# Patient Record
Sex: Male | Born: 1960 | Race: Black or African American | Hispanic: No | Marital: Married | Smoking: Never smoker
Health system: Southern US, Community
[De-identification: ages and names within clinical notes are randomized; demographics above are authoritative.]

## PROBLEM LIST (undated history)

## (undated) DIAGNOSIS — I10 Essential (primary) hypertension: Secondary | ICD-10-CM

## (undated) DIAGNOSIS — E7800 Pure hypercholesterolemia, unspecified: Secondary | ICD-10-CM

## (undated) DIAGNOSIS — I639 Cerebral infarction, unspecified: Secondary | ICD-10-CM

---

## 2002-11-26 DIAGNOSIS — I639 Cerebral infarction, unspecified: Secondary | ICD-10-CM

## 2002-11-26 HISTORY — DX: Cerebral infarction, unspecified: I63.9

## 2015-05-16 ENCOUNTER — Emergency Department (HOSPITAL_BASED_OUTPATIENT_CLINIC_OR_DEPARTMENT_OTHER): Payer: Medicare Other

## 2015-05-16 ENCOUNTER — Encounter (HOSPITAL_BASED_OUTPATIENT_CLINIC_OR_DEPARTMENT_OTHER): Payer: Self-pay | Admitting: *Deleted

## 2015-05-16 ENCOUNTER — Emergency Department (HOSPITAL_BASED_OUTPATIENT_CLINIC_OR_DEPARTMENT_OTHER)
Admission: EM | Admit: 2015-05-16 | Discharge: 2015-05-16 | Disposition: A | Discharge status: Home / Self Care | Payer: Medicare Other | Attending: Emergency Medicine | Admitting: Emergency Medicine

## 2015-05-16 DIAGNOSIS — Z8673 Personal history of transient ischemic attack (TIA), and cerebral infarction without residual deficits: Secondary | ICD-10-CM | POA: Insufficient documentation

## 2015-05-16 DIAGNOSIS — S4992XA Unspecified injury of left shoulder and upper arm, initial encounter: Secondary | ICD-10-CM | POA: Diagnosis present

## 2015-05-16 DIAGNOSIS — Y9389 Activity, other specified: Secondary | ICD-10-CM | POA: Insufficient documentation

## 2015-05-16 DIAGNOSIS — Z8639 Personal history of other endocrine, nutritional and metabolic disease: Secondary | ICD-10-CM | POA: Insufficient documentation

## 2015-05-16 DIAGNOSIS — I10 Essential (primary) hypertension: Secondary | ICD-10-CM | POA: Diagnosis not present

## 2015-05-16 DIAGNOSIS — Y92009 Unspecified place in unspecified non-institutional (private) residence as the place of occurrence of the external cause: Secondary | ICD-10-CM | POA: Insufficient documentation

## 2015-05-16 DIAGNOSIS — F10120 Alcohol abuse with intoxication, uncomplicated: Secondary | ICD-10-CM | POA: Diagnosis not present

## 2015-05-16 DIAGNOSIS — S43005A Unspecified dislocation of left shoulder joint, initial encounter: Secondary | ICD-10-CM | POA: Insufficient documentation

## 2015-05-16 DIAGNOSIS — W01198A Fall on same level from slipping, tripping and stumbling with subsequent striking against other object, initial encounter: Secondary | ICD-10-CM | POA: Diagnosis not present

## 2015-05-16 DIAGNOSIS — F10920 Alcohol use, unspecified with intoxication, uncomplicated: Secondary | ICD-10-CM

## 2015-05-16 DIAGNOSIS — Y998 Other external cause status: Secondary | ICD-10-CM | POA: Insufficient documentation

## 2015-05-16 HISTORY — DX: Cerebral infarction, unspecified: I63.9

## 2015-05-16 HISTORY — DX: Essential (primary) hypertension: I10

## 2015-05-16 HISTORY — DX: Pure hypercholesterolemia, unspecified: E78.00

## 2015-05-16 MED ORDER — LIDOCAINE HCL 2 % IJ SOLN
20.0000 mL | Freq: Once | INTRAMUSCULAR | Status: AC
  Administered 2015-05-16: 200 mg
  Filled 2015-05-16: qty 20

## 2015-05-16 NOTE — ED Notes (Signed)
L shoulder reduced by Dr. Wilkie Aye, tolerated well, CMS remains intact, "feel better", shouolder immobilizer applied, cxr called for. Family x2 at [facility].

## 2015-05-16 NOTE — Discharge Instructions (Signed)
Wear sling until follow-up with Dr. Pearletha Forge. Shoulder Dislocation Your shoulder is made up of three bones: the collar bone (clavicle); the shoulder blade (scapula), which includes the socket (glenoid cavity); and the upper arm bone (humerus). Your shoulder joint is the place where these bones meet. Strong, fibrous tissues hold these bones together (ligaments). Muscles and strong, fibrous tissues that connect the muscles to these bones (tendons) allow your arm to move through this joint. The range of motion of your shoulder joint is more extensive than most of your other joints, and the glenoid cavity is very shallow. That is the reason that your shoulder joint is one of the most unstable joints in your body. It is far more prone to dislocation than your other joints. Shoulder dislocation is when your humerus is forced out of your shoulder joint. CAUSES Shoulder dislocation is caused by a forceful impact on your shoulder. This impact usually is from an injury, such as a sports injury or a fall. SYMPTOMS Symptoms of shoulder dislocation include:  Deformity of your shoulder.  Intense pain.  Inability to move your shoulder joint.  Numbness, weakness, or tingling around your shoulder joint (your neck or down your arm).  Bruising or swelling around your shoulder. DIAGNOSIS In order to diagnose a dislocated shoulder, your caregiver will perform a physical exam. Your caregiver also may have an X-ray exam done to see if you have any broken bones. Magnetic resonance imaging (MRI) is a procedure that sometimes is done to help your caregiver see any damage to the soft tissues around your shoulder, particularly your rotator cuff tendons. Additionally, your caregiver also may have electromyography done to measure the electrical discharges produced in your muscles if you have signs or symptoms of nerve damage. TREATMENT A shoulder dislocation is treated by placing the humerus back in the joint (reduction). Your  caregiver does this either manually (closed reduction), by moving your humerus back into the joint through manipulation, or through surgery (open reduction). When your humerus is back in place, severe pain should improve almost immediately. You also may need to have surgery if you have a weak shoulder joint or ligaments, and you have recurring shoulder dislocations, despite rehabilitation. In rare cases, surgery is necessary if your nerves or blood vessels are damaged during the dislocation. After your reduction, your arm will be placed in a shoulder immobilizer or sling to keep it from moving. Your caregiver will have you wear your shoulder immobilizer or sling for 3 days to 3 weeks, depending on how serious your dislocation is. When your shoulder immobilizer or sling is removed, your caregiver may prescribe physical therapy to help improve the range of motion in your shoulder joint. HOME CARE INSTRUCTIONS  The following measures can help to reduce pain and speed up the healing process:  Rest your injured joint. Do not move it. Avoid activities similar to the one that caused your injury.  Apply ice to your injured joint for the first day or two after your reduction or as directed by your caregiver. Applying ice helps to reduce inflammation and pain.  Put ice in a plastic bag.  Place a towel between your skin and the bag.  Leave the ice on for 15-20 minutes at a time, every 2 hours while you are awake.  Exercise your hand by squeezing a soft ball. This helps to eliminate stiffness and swelling in your hand and wrist.  Take over-the-counter or prescription medicine for pain or discomfort as told by your caregiver. SEEK  IMMEDIATE MEDICAL CARE IF:   Your shoulder immobilizer or sling becomes damaged.  Your pain becomes worse rather than better.  You lose feeling in your arm or hand, or they become white and cold. MAKE SURE YOU:   Understand these instructions.  Will watch your  condition.  Will get help right away if you are not doing well or get worse. Document Released: 08/07/2001 Document Revised: 03/29/2014 Document Reviewed: 09/02/2011 [facility] Patient Information 2015 [place], [place]. This information is not intended to replace advice given to you by your health care provider. Make sure you discuss any questions you have with your health care provider.

## 2015-05-16 NOTE — ED Notes (Signed)
L shoulder reduction unsuccessful x4 attempts (tolerated well). Pt now prone with L arm hanging off of bed, 5lb weight applied (tolerating well), CMS intact (brachial, radial and ulnar pulses intact, cap refill <2sec, hand fingers warm).

## 2015-05-16 NOTE — ED Notes (Signed)
Post reduction portable at [facility].

## 2015-05-16 NOTE — ED Notes (Addendum)
Family at [facility]. Pt using urinal. Pending imaging. VSS, no changes. Resting comfortably.

## 2015-05-16 NOTE — ED Notes (Signed)
Pt fell at 0030, here by POV with family x2, admits to some ETOH tonight, was locking house up for the night and tripped/ lost balance and fell, relates to L sided residual defecits from [address] CVA, c/o L scapular pain, denies other pains or sx, denies LOC, "think it might be out of socket", no obvious deformity, CMS/ pulses intact. Guarding L arm movements. Also has residual  L arm weakness. Pt lists PCP as Dr. Tera Mater with [facility] on [facility]. 8/10 pain.

## 2015-05-16 NOTE — ED Notes (Signed)
Dr. Horton at [facility].  

## 2015-05-16 NOTE — ED Notes (Addendum)
Delay in triage, VSS, remains on monitor, alert, NAD, calm, family x2 at [facility].

## 2015-05-16 NOTE — ED Provider Notes (Signed)
CSN: [id number]     Arrival date & time 05/16/15  0111 History   First MD Initiated Contact with Patient 05/16/15 0201     Chief Complaint  Patient presents with  . Fall     (Consider location/radiation/quality/duration/timing/severity/associated sxs/prior Treatment) HPI  This a 54 year old male with history of CVA with left-sided deficits who presents following a fall. Per the patient and his family, patient was going to check the doors in his house when he fell hurting his left shoulder. He also hit his head. Patient reports drinking "a few beers tonight." He had baseline has left-sided deficits secondary to a stroke. He denies loss of consciousness. He is on a baby aspirin daily. He denies syncope or dizziness. He denies any other injury.  Past Medical History  Diagnosis Date  . Stroke 2004    L side weakness residual  . Hypertension   . Hypercholesterolemia    History reviewed. No pertinent past surgical history. History reviewed. No pertinent family history. History  Substance Use Topics  . Smoking status: Never Smoker   . Smokeless tobacco: Not on file  . Alcohol Use: Yes    Review of Systems  Constitutional: Negative.   Respiratory: Negative.  Negative for chest tightness and shortness of breath.   Cardiovascular: Negative.  Negative for chest pain.  Gastrointestinal: Negative.  Negative for abdominal pain.  Genitourinary: Negative.   Musculoskeletal: Negative for back pain.        left shoulder pain  Skin: Negative for wound.  Neurological: Negative for dizziness, syncope, light-headedness and headaches.  All other systems reviewed and are negative.     Allergies  Review of patient's allergies indicates no known allergies.  Home Medications   Prior to Admission medications   Not on File   BP 127/87 mmHg  Pulse 70  Temp(Src) 97.6 F (36.4 C) (Oral)  Resp 20  Ht  (1.854 m)  Wt 175 lb (79.379 kg)  BMI 23.09 kg/m2  SpO2 100% Physical Exam   Constitutional: He is oriented to person, place, and time.  Appears intoxicated, intermittently sleeps during interview  HENT:  Head: Normocephalic and atraumatic.  Right Ear: External ear normal.  Left Ear: External ear normal.  Mouth/Throat: Oropharynx is clear and moist.  Eyes:  Pupils 2 mm minimally reactive  Neck: Neck supple.  No midline C-spine tenderness  Cardiovascular: Normal rate, regular rhythm and normal heart sounds.   No murmur heard. Pulmonary/Chest: Effort normal and breath sounds normal. No respiratory distress. He has no wheezes.  Abdominal: Soft. Bowel sounds are normal. There is no tenderness. There is no rebound.  Musculoskeletal: He exhibits no edema.  Vacant glenoid fossa with humeral head palpated anteriorly, decreased range of motion of the left shoulder, 2+ radial pulse, sensation intact distally, strength at baseline  Neurological: He is alert and oriented to person, place, and time.  Somnolent but arousal  Skin: Skin is warm and dry.  Psychiatric: He has a normal mood and affect.  Nursing note and vitals reviewed.   ED Course  Reduction of dislocation Date/Time: 05/16/2015 5:06 AM Performed by: Shon Baton Authorized by: Shon Baton Consent: Verbal consent obtained. Risks and benefits: risks, benefits and alternatives were discussed Consent given by: patient Local anesthesia used: yes Anesthesia: local infiltration Local anesthetic: lidocaine 1% without epinephrine Anesthetic total: 10 ml Patient sedated: no Patient tolerance: Patient tolerated the procedure well with no immediate complications Comments: Anterior shoulder dislocation reduced at the bedside with traction countertraction and  manipulation of the humeral head, neurovascularly intact postreduction   (including critical care time) Labs Review Labs Reviewed - No data to display  Imaging Review Ct Head Wo Contrast  05/16/2015   CLINICAL DATA:  Patient status post fall.  Left shoulder pain. No loss of consciousness. History of prior right-sided infarct.  EXAM: CT HEAD WITHOUT CONTRAST  TECHNIQUE: Contiguous axial images were obtained from the base of the skull through the vertex without intravenous contrast.  COMPARISON:  None.  FINDINGS: Ex vacuo dilatation of the right lateral ventricle. Encephalomalacia within the right cerebral hemisphere likely sequelae of prior infarct. Right periventricular white matter hypodensity. No evidence for acute intracranial hemorrhage, acute cortically based infarct, mass lesion or mass effect. Orbits are unremarkable. Mucosal thickening involving the frontal and maxillary sinuses as well as the ethmoid air cells. Mastoid air cells are well aerated. Calvarium is intact. Age-indeterminate incompletely visualized nasal bone fracture.  IMPRESSION: Likely sequelae of prior infarct involving the right cerebral hemisphere.  Age-indeterminate incompletely visualized nasal bone fracture.  Right periventricular white matter hypodensity, likely sequelae of chronic microvascular ischemic change.   Electronically Signed   By: Annia Belt M.D.   On: 05/16/2015 03:52   Dg Shoulder Left  05/16/2015   CLINICAL DATA:  Status post fall; left shoulder pain. Initial encounter.  EXAM: LEFT SHOULDER - 2+ VIEW  COMPARISON:  None.  FINDINGS: There is anterior dislocation of the left humeral head, with mild impaction of the humeral head against the anterior-inferior glenoid and a likely small Hill-Sachs lesion. No definite osseous Bankart lesion is characterized.  Mild degenerative change is noted at the left acromioclavicular joint. No definite soft tissue abnormalities are seen. The left lung appears clear.  IMPRESSION: Anterior dislocation of the left humeral head, with mild impaction of the humeral head against the anterior-inferior glenoid and a likely small Hill-Sachs lesion. No definite osseous Bankart lesion seen.   Electronically Signed   By: Roanna Raider M.D.    On: 05/16/2015 03:48   Dg Shoulder Left Port  05/16/2015   CLINICAL DATA:  Status post reduction of left humeral head dislocation. Initial encounter.  EXAM: LEFT SHOULDER - 1 VIEW  COMPARISON:  Left shoulder radiographs performed earlier today at 2:54 a.m.  FINDINGS: There has been successful reduction of the left humeral head dislocation. An apparent small fracture line is noted along the lateral aspect of the humeral head, possibly reflecting a greater tuberosity fracture, difficult to fully assess given the positioning of the shoulder.  No definite osseous Bankart lesion is characterized. The visualized portions of the left lung are clear.  IMPRESSION: Successful reduction of left humeral head dislocation. Apparent small fracture line noted along the lateral aspect of the humeral head, possibly reflecting an underlying greater tuberosity fracture.   Electronically Signed   By: Roanna Raider M.D.   On: 05/16/2015 04:46     EKG Interpretation None      MDM   Final diagnoses:  Shoulder dislocation, left, initial encounter  Alcohol intoxication, uncomplicated    Patient presents following a fall. Patient reports alcohol use prior to the fall and appears intoxicated on exam. ABCs intact and otherwise nontoxic. Clinically appears to have a left shoulder dislocation. Given intoxication and history of CVA, will obtain CT head given that he hit his head. CT is negative. Dislocation was reduced at the bedside with only local anesthesia given that the patient is a poor sedation candidate because of his alcohol intoxication. Patient tolerated the procedure well.  Will discharge home with sports medicine follow-up.  After history, exam, and medical workup I feel the patient has been appropriately medically screened and is safe for discharge home. Pertinent diagnoses were discussed with the patient. Patient was given return precautions.   Shon Baton, MD 05/16/15 [phone]

## 2015-05-19 ENCOUNTER — Encounter: Payer: Self-pay | Admitting: Family Medicine

## 2015-05-19 ENCOUNTER — Ambulatory Visit (INDEPENDENT_AMBULATORY_CARE_PROVIDER_SITE_OTHER): Payer: Medicare Other | Admitting: Family Medicine

## 2015-05-19 ENCOUNTER — Ambulatory Visit: Payer: TRICARE For Life (TFL) | Admitting: Family Medicine

## 2015-05-19 VITALS — BP 137/96 | Ht 72.0 in | Wt 175.0 lb

## 2015-05-19 DIAGNOSIS — S43015A Anterior dislocation of left humerus, initial encounter: Secondary | ICD-10-CM

## 2015-05-19 NOTE — Progress Notes (Signed)
PCP: Katherine Basset, PA-C  Subjective:   HPI: Patient is a 54 y.o. male here for left shoulder pain.  Patient reports on 6/19 he was going to check the doors of his house when he fell and injured his left shoulder. Had a few beers prior to this injury. No prior shoulder injuries. Is right handed. Has a history of CVA but reports he had full motion of the left shoulder prior to the injury. Radiographs showed dislocation which was reduced in the ED.  But also has a possible fracture of the greater tuberosity. Wearing sling regularly. Taking aleve for pain. Pain level currently 4/10, tolerable.  Past Medical History  Diagnosis Date  . Stroke 2004    L side weakness residual  . Hypertension   . Hypercholesterolemia     No current outpatient prescriptions on file prior to visit.   No current facility-administered medications on file prior to visit.    No past surgical history on file.  No Known Allergies  History   Social History  . Marital Status: Married    Spouse Name: N/A  . Number of Children: N/A  . Years of Education: N/A   Occupational History  . Not on file.   Social History Main Topics  . Smoking status: Never Smoker   . Smokeless tobacco: Not on file  . Alcohol Use: Yes  . Drug Use: No  . Sexual Activity: Not on file   Other Topics Concern  . Not on file   Social History Narrative    No family history on file.  BP 137/96 mmHg  Ht 6' (1.829 m)  Wt 175 lb (79.379 kg)  BMI 23.73 kg/m2  Review of Systems: See HPI above.    Objective:  Physical Exam:  Gen: NAD  Left shoulder: No swelling, ecchymoses.  No gross deformity. No TTP about shoulder currently. FROM digits, wrist, elbow.  Did not test current shoulder ROM given recent dislocation. Decreased sensation throughout left arm - patient reports this is his baseline however.    Assessment & Plan:  1. Left shoulder dislocation - s/p reduction with possible greater tuberosity  fracture.  Will continue with sling, aleve.  F/u in 2 weeks - will repeat x-rays at that time.  May need more prolonged immobilization that usual if he has a concurrent fracture.  Otherwise will start ROM exercises.

## 2015-05-19 NOTE — Assessment & Plan Note (Signed)
s/p reduction with possible greater tuberosity fracture.  Will continue with sling, aleve.  F/u in 2 weeks - will repeat x-rays at that time.  May need more prolonged immobilization that usual if he has a concurrent fracture.  Otherwise will start ROM exercises.

## 2015-05-19 NOTE — Patient Instructions (Signed)
You dislocated your shoulder and likely have a hairline fracture of the upper arm bone. Wear sling regularly. Ok to come out of this to do motion exercises. Icing 15 minutes at a time 3-4 times a day. Aleve 2 tabs twice a day with food for pain and inflammation. Let me know if you need something stronger. Follow up with me in 2 weeks.

## 2015-06-02 ENCOUNTER — Ambulatory Visit: Payer: TRICARE For Life (TFL) | Admitting: Family Medicine

## 2015-06-20 ENCOUNTER — Telehealth: Payer: Self-pay | Admitting: Family Medicine

## 2015-06-20 NOTE — Telephone Encounter (Signed)
Spoke to patient and scheduled him to come in on the 29th for his follow up.

## 2015-06-20 NOTE — Telephone Encounter (Signed)
He was to follow-up 2 weeks after his last appointment to reexamine and see if he needed to continue out to 6 weeks or we could start him in physical therapy.

## 2015-06-24 ENCOUNTER — Encounter: Payer: Self-pay | Admitting: Family Medicine

## 2015-06-24 ENCOUNTER — Ambulatory Visit (HOSPITAL_BASED_OUTPATIENT_CLINIC_OR_DEPARTMENT_OTHER)
Admission: RE | Admit: 2015-06-24 | Discharge: 2015-06-24 | Disposition: A | Discharge status: Home / Self Care | Payer: Medicare Other | Source: Ambulatory Visit | Attending: Family Medicine | Admitting: Family Medicine

## 2015-06-24 ENCOUNTER — Ambulatory Visit (INDEPENDENT_AMBULATORY_CARE_PROVIDER_SITE_OTHER): Payer: Medicare Other | Admitting: Family Medicine

## 2015-06-24 VITALS — BP 125/80 | HR 83 | Ht 73.0 in | Wt 180.0 lb

## 2015-06-24 DIAGNOSIS — S43005D Unspecified dislocation of left shoulder joint, subsequent encounter: Secondary | ICD-10-CM | POA: Insufficient documentation

## 2015-06-24 DIAGNOSIS — S42292D Other displaced fracture of upper end of left humerus, subsequent encounter for fracture with routine healing: Secondary | ICD-10-CM | POA: Diagnosis not present

## 2015-06-24 DIAGNOSIS — S43015A Anterior dislocation of left humerus, initial encounter: Secondary | ICD-10-CM | POA: Diagnosis not present

## 2015-06-24 DIAGNOSIS — X58XXXD Exposure to other specified factors, subsequent encounter: Secondary | ICD-10-CM | POA: Insufficient documentation

## 2015-06-24 MED ORDER — TRAMADOL HCL 50 MG PO TABS: 50.0000 mg | ORAL_TABLET | Freq: Three times a day (TID) | ORAL | Status: AC | PRN

## 2015-06-24 NOTE — Patient Instructions (Addendum)
Start home exercises and physical therapy over the next 1 month. Tylenol, tramadol as needed for pain. Follow up with me in 1 month. We will consider an MRI of this shoulder if not improving.

## 2015-06-27 NOTE — Assessment & Plan Note (Signed)
s/p reduction with greater tuberosity fracture.  Fracture appears to be healing well.  Concerned about his lack of motion however.  We discussed two options - MRI to assess for complete rotator cuff tear vs PT, home exercises and reassess in 1 month.  He would like to do physical therapy and reports he did not have a fully functioning left shoulder prior to the dislocation.  Question about whether he would be a good surgical candidate as well with h/o CVA.

## 2015-06-27 NOTE — Progress Notes (Signed)
PCP: Katherine Basset, PA-C  Subjective:   HPI: Patient is a 54 y.o. male here for left shoulder pain.  6/23: Patient reports on 6/19 he was going to check the doors of his house when he fell and injured his left shoulder. Had a few beers prior to this injury. No prior shoulder injuries. Is right handed. Has a history of CVA but reports he had full motion of the left shoulder prior to the injury. Radiographs showed dislocation which was reduced in the ED.  But also has a possible fracture of the greater tuberosity. Wearing sling regularly. Taking aleve for pain. Pain level currently 4/10, tolerable.  7/29: Patient returns with continued soreness in left shoulder. Has been using sling still. Pain level 5/10. Taking aspirin. Missed 2 week follow up appointment with [place]. Today he reported contrary to last visit that he did not have full motion of this shoulder before the dislocation.  Past Medical History  Diagnosis Date  . Stroke 2004    L side weakness residual  . Hypertension   . Hypercholesterolemia     Current Outpatient Prescriptions on File Prior to Visit  Medication Sig Dispense Refill  . aspirin 81 MG EC tablet Take 81 mg by mouth daily.  11  . telmisartan (MICARDIS) 40 MG tablet Take 40 mg by mouth daily.    . verapamil (CALAN) 80 MG tablet Take 80 mg by mouth 3 (three) times daily.     No current facility-administered medications on file prior to visit.    No past surgical history on file.  No Known Allergies  History   Social History  . Marital Status: Married    Spouse Name: N/A  . Number of Children: N/A  . Years of Education: N/A   Occupational History  . Not on file.   Social History Main Topics  . Smoking status: Never Smoker   . Smokeless tobacco: Not on file  . Alcohol Use: 0.0 oz/week    0 Standard drinks or equivalent per week  . Drug Use: No  . Sexual Activity: Not on file   Other Topics Concern  . Not on file   Social History  Narrative    No family history on file.  BP 125/80 mmHg  Pulse 83  Ht  (1.854 m)  Wt 180 lb (81.647 kg)  BMI 23.75 kg/m2  Review of Systems: See HPI above.    Objective:  Physical Exam:  Gen: NAD  Left shoulder: No swelling, ecchymoses.  No gross deformity. No TTP about shoulder currently. FROM digits, wrist, elbow.  Abduction to 40 degrees, flexion 40 degrees, ER 30 degrees.  Decreased sensation throughout left arm - patient reports this is his baseline however.    Assessment & Plan:  1. Left shoulder dislocation - s/p reduction with greater tuberosity fracture.  Fracture appears to be healing well.  Concerned about his lack of motion however.  We discussed two options - MRI to assess for complete rotator cuff tear vs PT, home exercises and reassess in 1 month.  He would like to do physical therapy and reports he did not have a fully functioning left shoulder prior to the dislocation.  Question about whether he would be a good surgical candidate as well with h/o CVA.

## 2015-06-30 ENCOUNTER — Ambulatory Visit: Payer: Medicare Other | Attending: Family Medicine | Admitting: Physical Therapy

## 2015-07-12 ENCOUNTER — Ambulatory Visit: Payer: TRICARE For Life (TFL) | Admitting: Family Medicine

## 2015-07-12 ENCOUNTER — Encounter (INDEPENDENT_AMBULATORY_CARE_PROVIDER_SITE_OTHER): Payer: Self-pay

## 2015-07-20 ENCOUNTER — Ambulatory Visit: Payer: Medicare Other | Admitting: Physical Therapy

## 2015-07-25 ENCOUNTER — Ambulatory Visit: Payer: TRICARE For Life (TFL) | Admitting: Family Medicine

## 2015-07-27 ENCOUNTER — Ambulatory Visit: Payer: Medicare Other | Admitting: Physical Therapy

## 2015-09-24 IMAGING — CT CT HEAD W/O CM
1 series · 15 of 30 positions shown, 19 images · non-contrast
Comparison: None.

CLINICAL DATA: Patient status post fall. Left shoulder pain. No
loss of consciousness. History of prior right-sided infarct.

EXAM:
CT HEAD WITHOUT CONTRAST
TECHNIQUE: Contiguous axial images were obtained from the base of the skull
through the vertex without intravenous contrast.

[Series 2: head 4.8 h37s · axial · 0.46mm/px · z∈[-147,-14]mm · 15 of 32 slices shown, 19 images]
[im 2/32  brain]
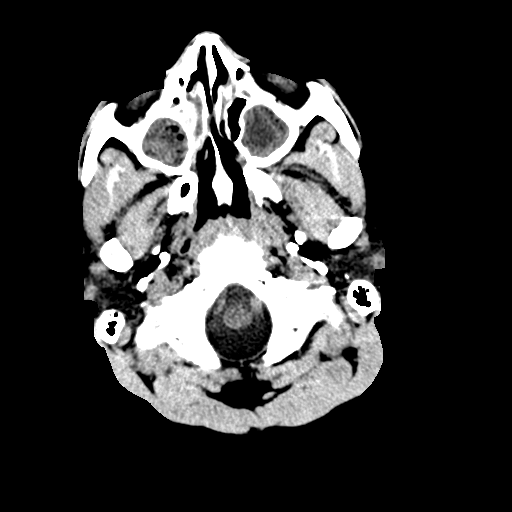
[im 2/32  bone]
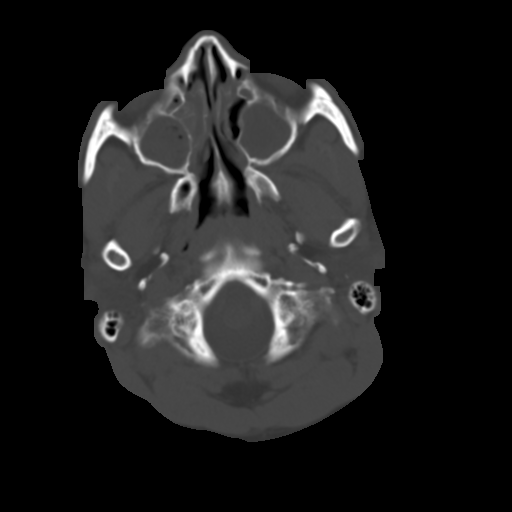
[im 4/32  brain]
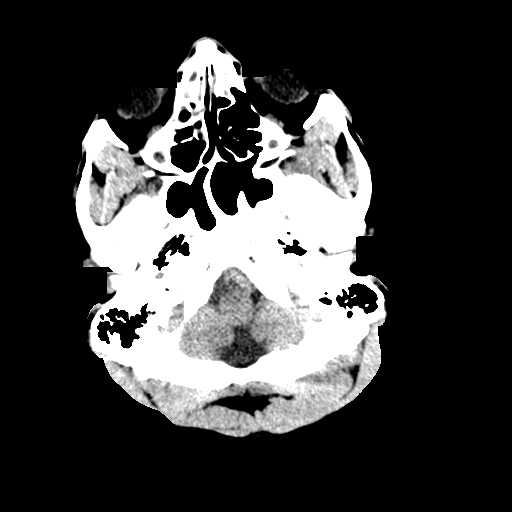
[im 6/32  brain]
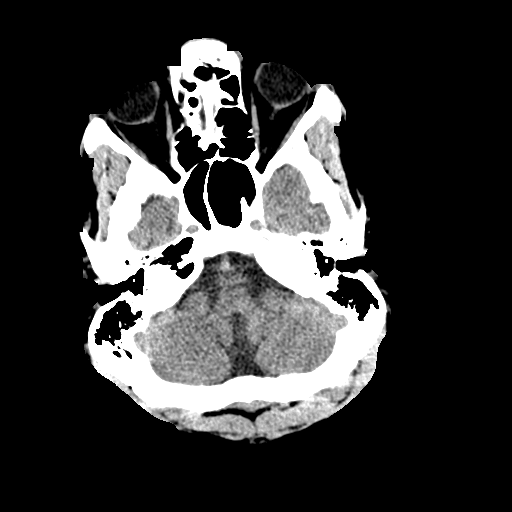
[im 8/32  brain]
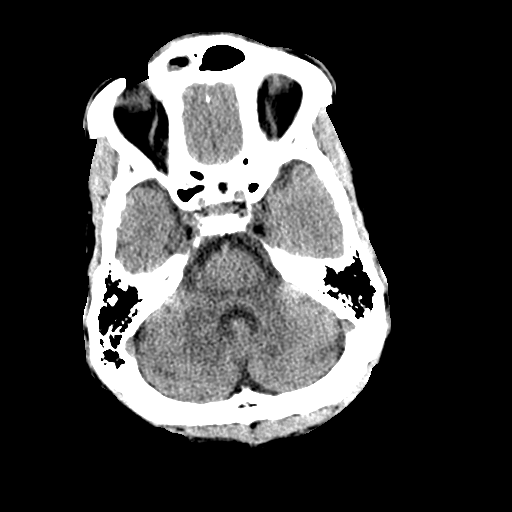
[im 10/32  brain]
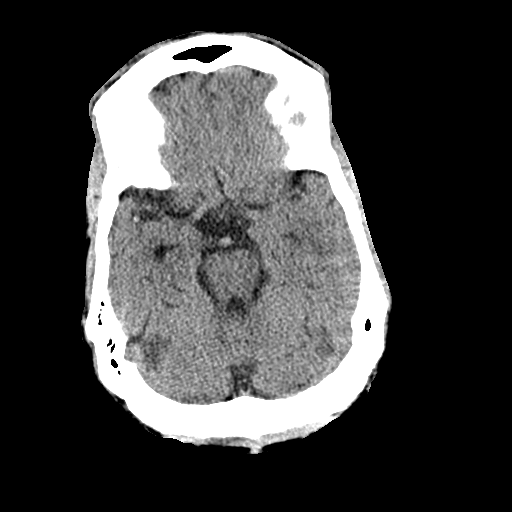
[im 10/32  bone]
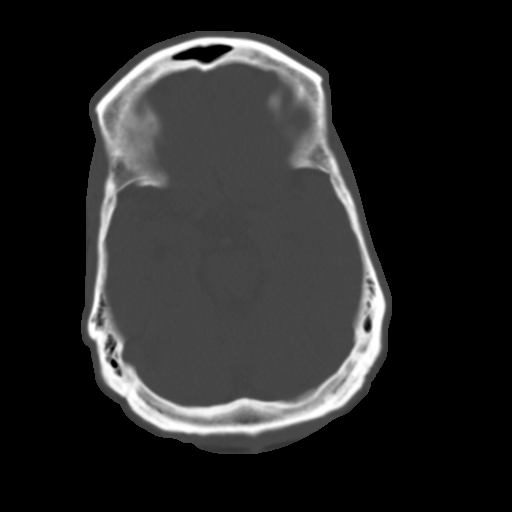
[im 12/32  brain]
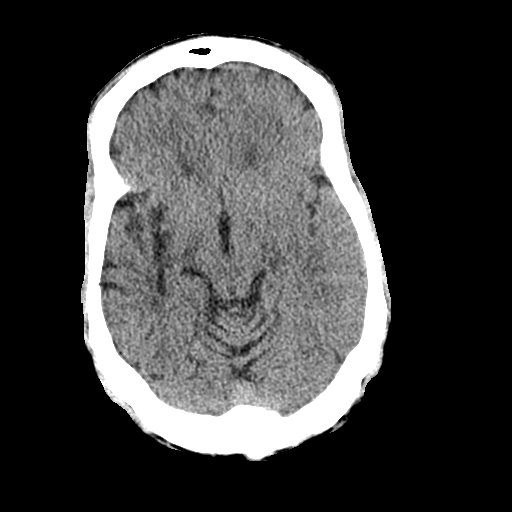
[im 14/32  brain]
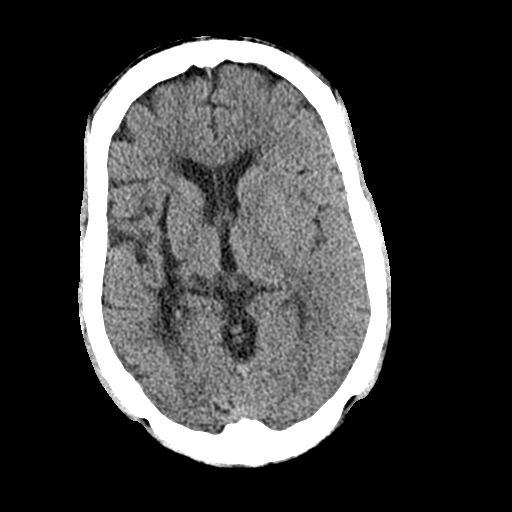
[im 17/32  brain]
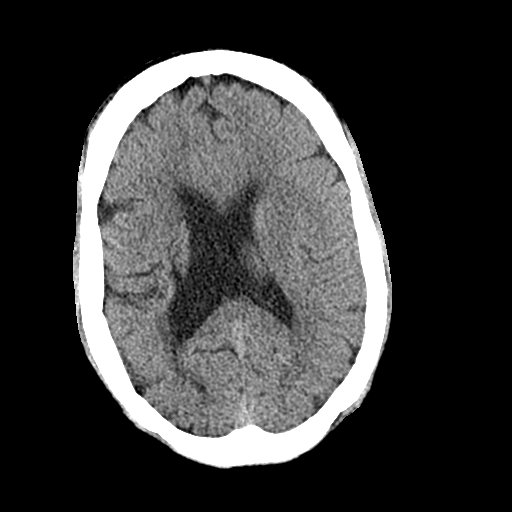
[im 18/32  brain]
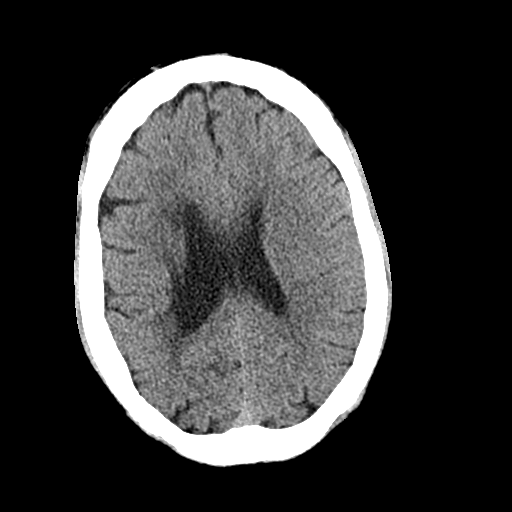
[im 18/32  bone]
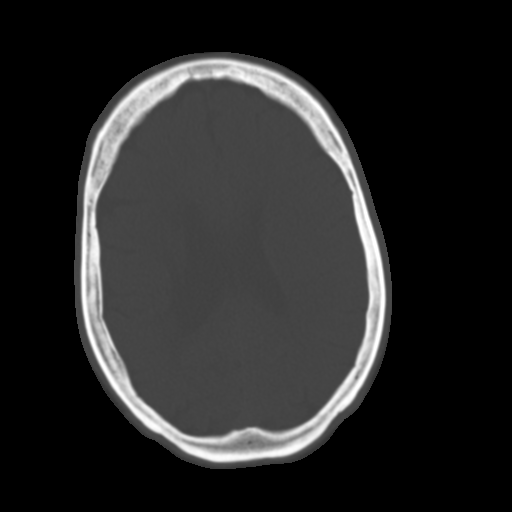
[im 20/32  brain]
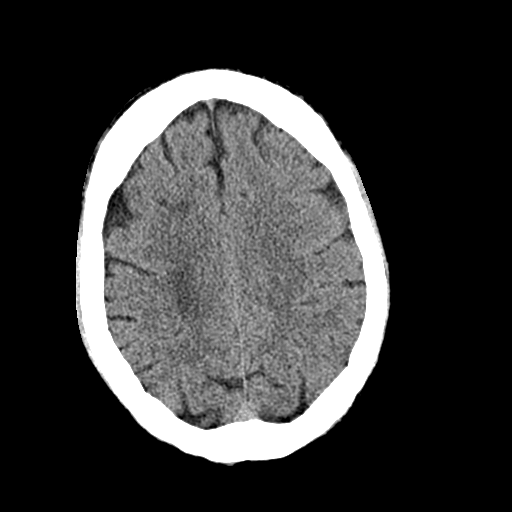
[im 22/32  brain]
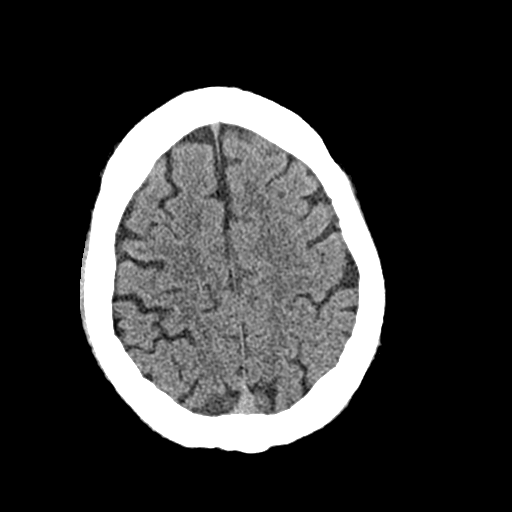
[im 24/32  brain]
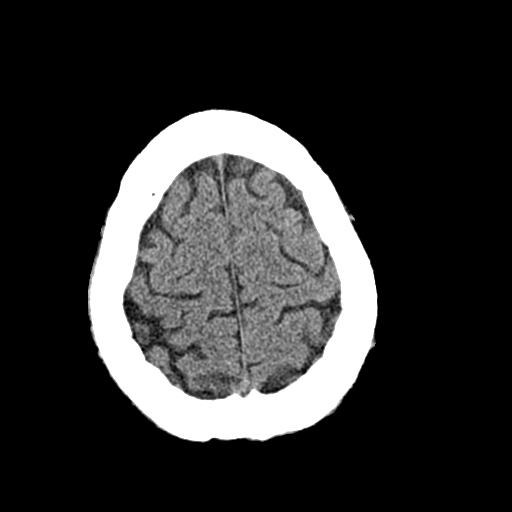
[im 26/32  brain]
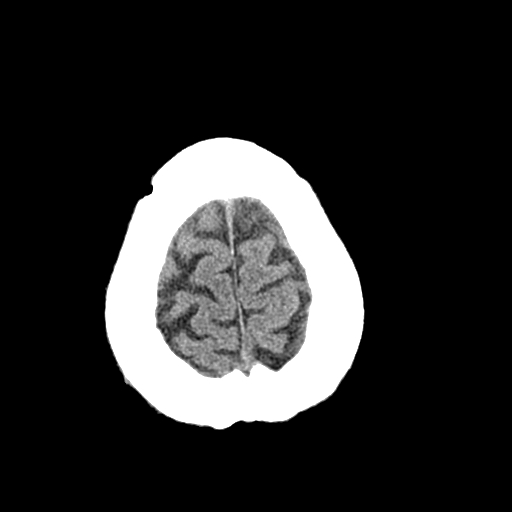
[im 26/32  bone]
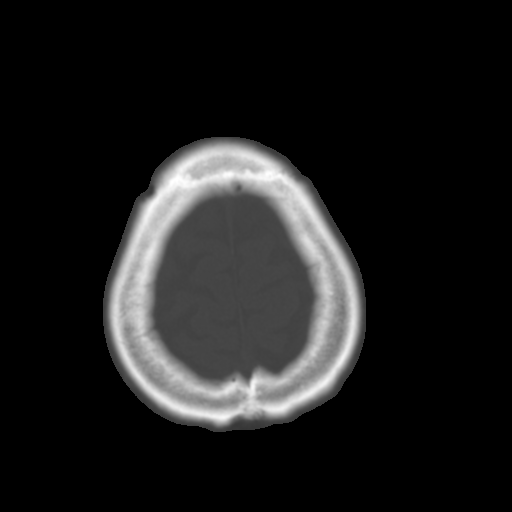
[im 28/32  brain]
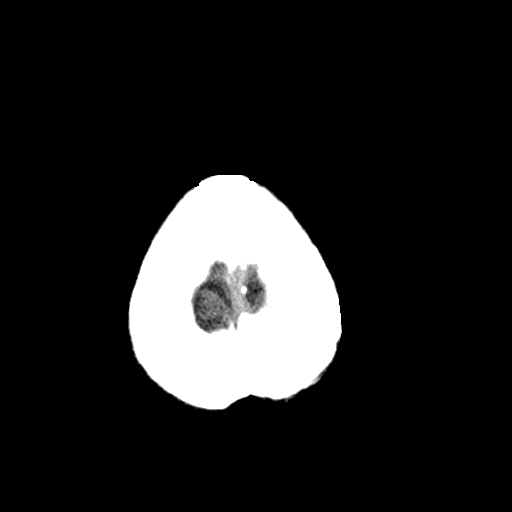
[im 30/32  brain]
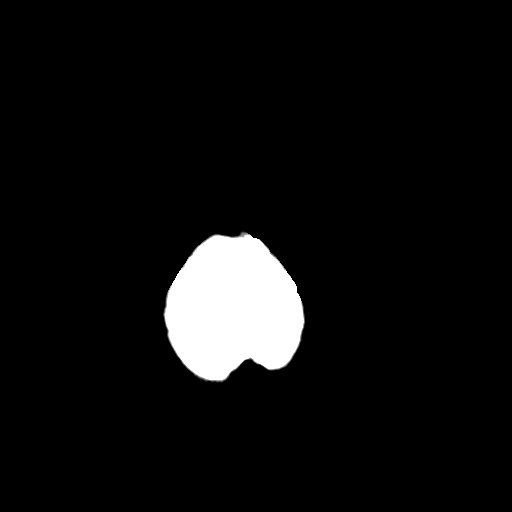

[15 of 30 positions shown; findings below may reference images not displayed]

FINDINGS: Ex vacuo dilatation of the right lateral ventricle. Encephalomalacia
within the right cerebral hemisphere likely sequelae of prior
infarct. Right periventricular white matter hypodensity. No evidence
for acute intracranial hemorrhage, acute cortically based infarct,
mass lesion or mass effect. Orbits are unremarkable. Mucosal
thickening involving the frontal and maxillary sinuses as well as
the ethmoid air cells. Mastoid air cells are well aerated. Calvarium
is intact. Age-indeterminate incompletely visualized nasal bone
fracture.
IMPRESSION: Likely sequelae of prior infarct involving the right cerebral
hemisphere.

Age-indeterminate incompletely visualized nasal bone fracture.

Right periventricular white matter hypodensity, likely sequelae of
chronic microvascular ischemic change.

## 2015-11-02 IMAGING — CR DG SHOULDER 2+V*L*
2 series · 2 of 2 positions shown · non-contrast
Comparison: 05/16/2015

CLINICAL DATA: Recent anterior dislocation of the left shoulder
with humeral head fracture. Patient unable to abduct. Subsequent
encounter.

EXAM:
LEFT SHOULDER - 2+ VIEW

[w shoulder ap internal left]
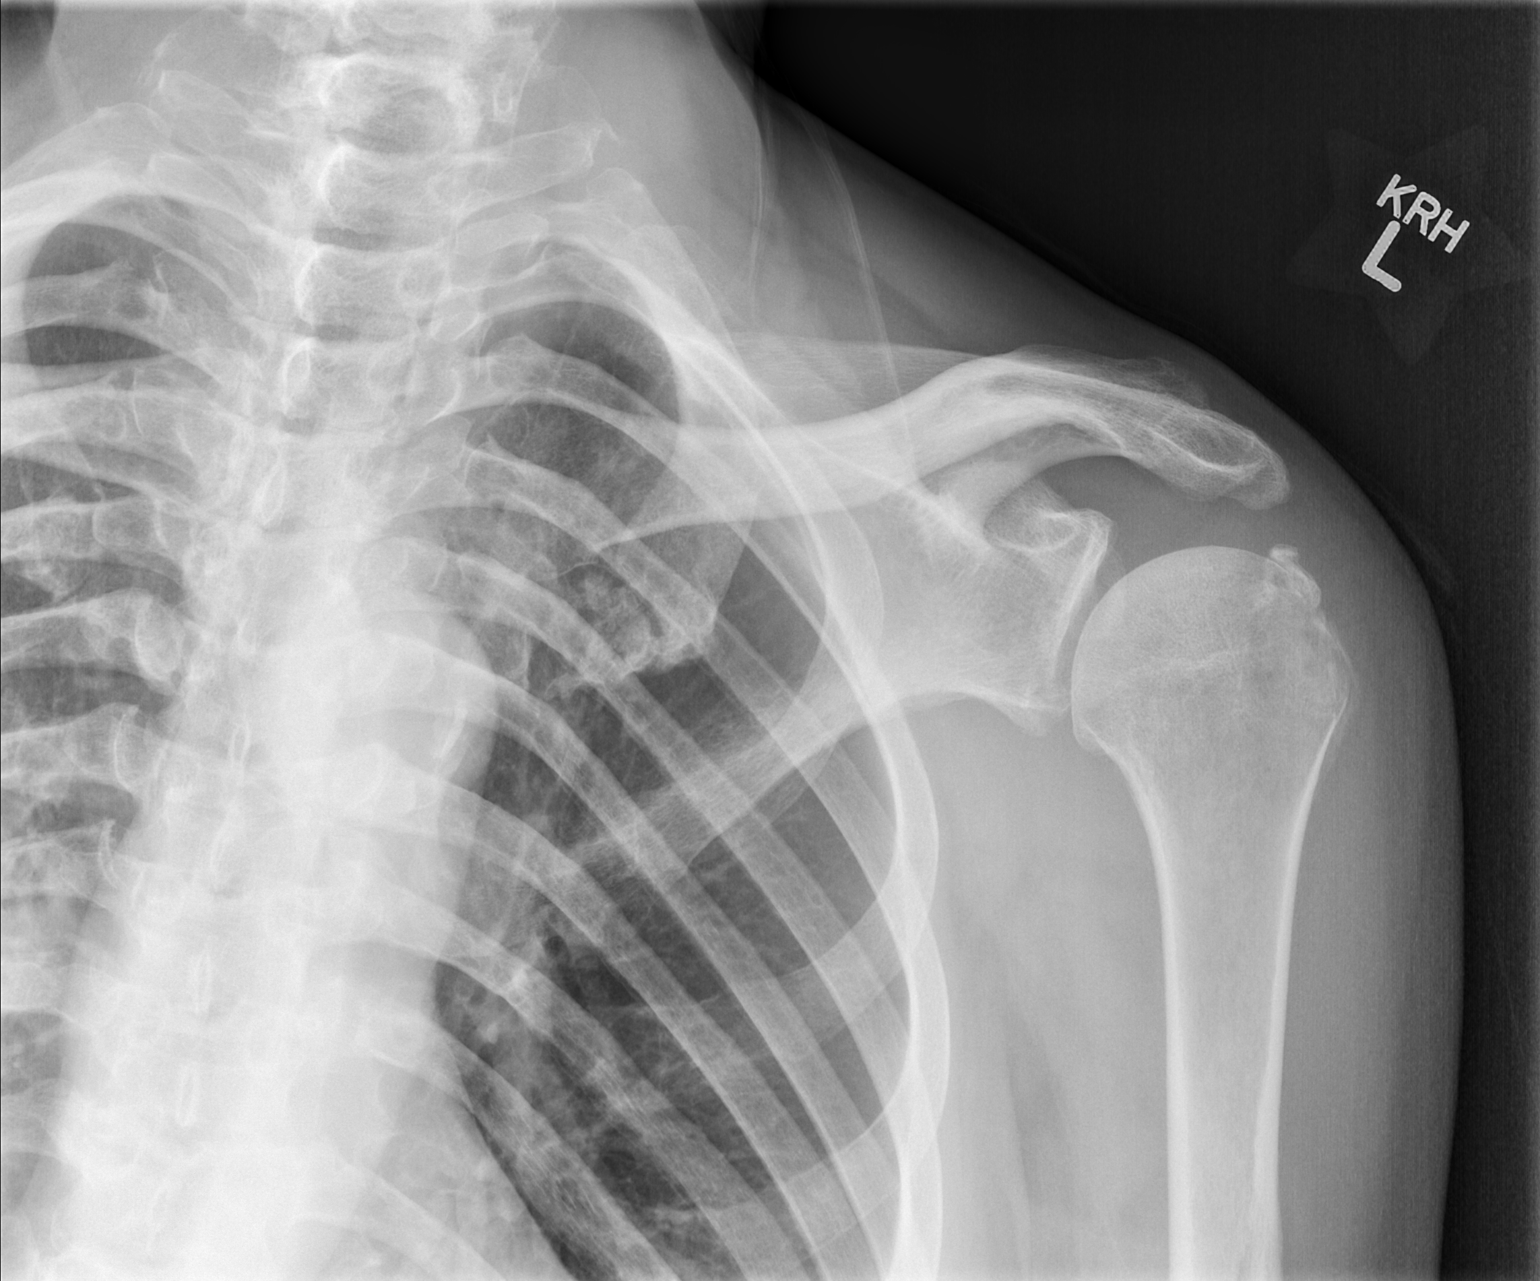

[w shoulder y view left]
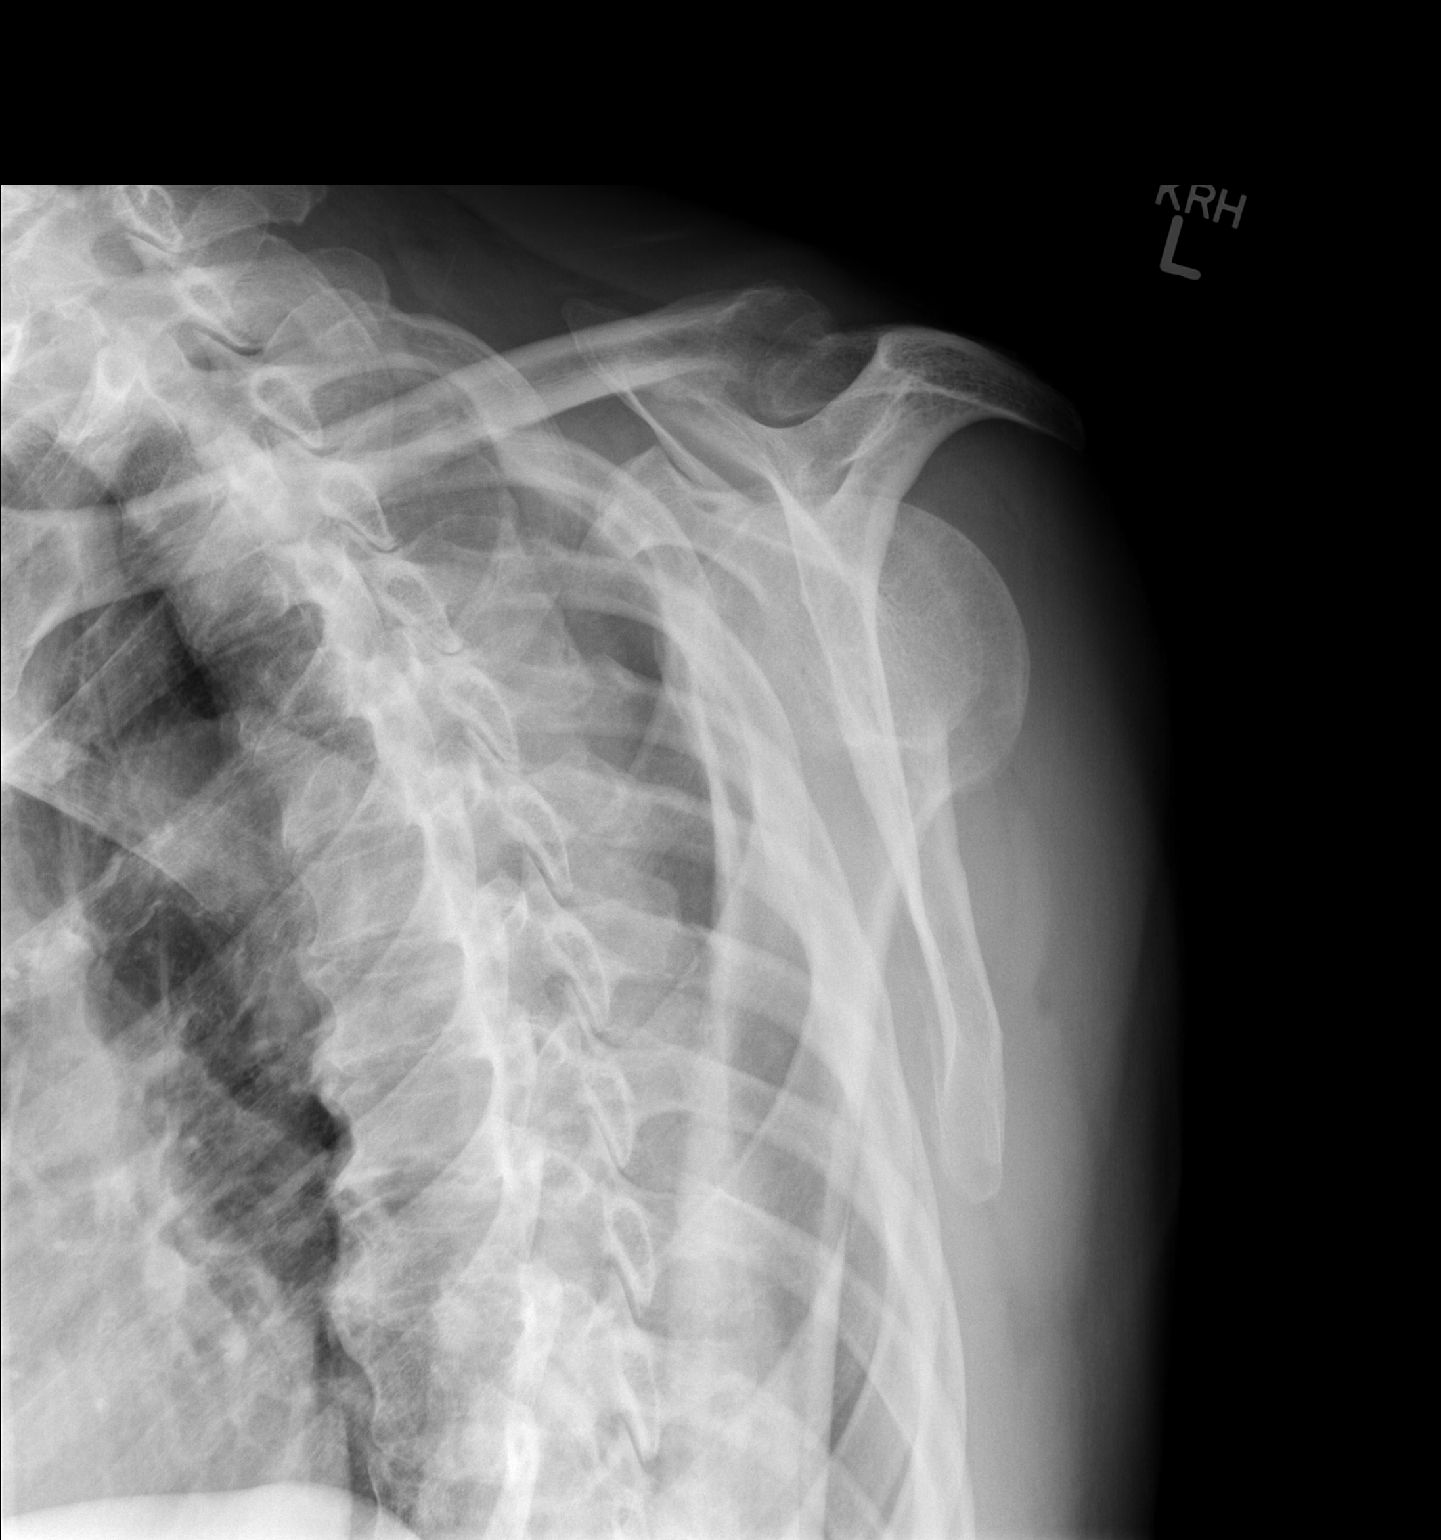

[2 of 2 positions shown; findings below may reference images not displayed]

FINDINGS: There is evidence of interval healing of the superolateral humeral
head fracture with callus formation and new bone formation seen.
This fracture and the bone formation is near the expected position
of the rotator cuff insertion. No evidence of new fracture or
subluxation. Stable proliferative changes are seen involving the AC
joint and distal acromion.
IMPRESSION: Evidence of some interval healing of the humeral head fracture with
callus formation and new bone formation. Injury is near the expected
position of the rotator cuff insertion.
